# Patient Record
Sex: Female | Born: 1995 | Race: Black or African American | Hispanic: No | Marital: Single | Smoking: Never smoker
Health system: Southern US, Community
[De-identification: ages and names within clinical notes are randomized; demographics above are authoritative.]

## PROBLEM LIST (undated history)

## (undated) HISTORY — PX: DENTAL SURGERY: SHX609

---
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
## 2017-07-18 ENCOUNTER — Emergency Department: Payer: No Typology Code available for payment source

## 2017-07-18 ENCOUNTER — Emergency Department
Admission: EM | Admit: 2017-07-18 | Discharge: 2017-07-18 | Disposition: A | Discharge status: Home / Self Care | Payer: No Typology Code available for payment source | Attending: Emergency Medicine | Admitting: Emergency Medicine

## 2017-07-18 ENCOUNTER — Encounter: Payer: Self-pay | Admitting: Emergency Medicine

## 2017-07-18 DIAGNOSIS — Y999 Unspecified external cause status: Secondary | ICD-10-CM | POA: Insufficient documentation

## 2017-07-18 DIAGNOSIS — Y9389 Activity, other specified: Secondary | ICD-10-CM | POA: Diagnosis not present

## 2017-07-18 DIAGNOSIS — Z79899 Other long term (current) drug therapy: Secondary | ICD-10-CM | POA: Insufficient documentation

## 2017-07-18 DIAGNOSIS — M542 Cervicalgia: Secondary | ICD-10-CM | POA: Insufficient documentation

## 2017-07-18 DIAGNOSIS — Y929 Unspecified place or not applicable: Secondary | ICD-10-CM | POA: Diagnosis not present

## 2017-07-18 DIAGNOSIS — M545 Low back pain: Secondary | ICD-10-CM | POA: Insufficient documentation

## 2017-07-18 MED ORDER — IBUPROFEN 800 MG PO TABS: 800.0000 mg | ORAL_TABLET | Freq: Three times a day (TID) | ORAL | 0 refills | Status: DC | PRN

## 2017-07-18 MED ORDER — CYCLOBENZAPRINE HCL 5 MG PO TABS: ORAL_TABLET | ORAL | 0 refills | Status: DC
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
## 2017-08-26 ENCOUNTER — Ambulatory Visit
Admission: EM | Admit: 2017-08-26 | Discharge: 2017-08-26 | Disposition: A | Discharge status: Home / Self Care | Payer: Medicaid Other | Attending: Family Medicine | Admitting: Family Medicine

## 2017-08-26 ENCOUNTER — Encounter: Payer: Self-pay | Admitting: Emergency Medicine

## 2017-08-26 DIAGNOSIS — J40 Bronchitis, not specified as acute or chronic: Secondary | ICD-10-CM | POA: Diagnosis not present

## 2017-08-26 DIAGNOSIS — R05 Cough: Secondary | ICD-10-CM

## 2017-08-26 MED ORDER — BENZONATATE 100 MG PO CAPS: 100.0000 mg | ORAL_CAPSULE | Freq: Three times a day (TID) | ORAL | 0 refills | Status: DC | PRN

## 2017-08-26 NOTE — ED Triage Notes (Signed)
Patient states she has a non-productive cough which is making her chest and stomach hurt

## 2017-08-26 NOTE — ED Provider Notes (Signed)
[facility]    CSN: [id number] Arrival date & time: 08/26/17  1443  History   Chief Complaint Chief Complaint  Patient presents with  . Cough    online appt.   HPI  21 year old female presents with cough.  Patient reports that she has had a one-week history of cough.  Nonproductive.  Moderate in severity.  No associated fevers or chills.  No shortness of breath.  She has had a sick contact at work.  She has taken TheraFlu without improvement.  No known exacerbating factors.  No other associated symptoms.  No other complaints at this time.  PMH - None  Past Surgical History:  Procedure Laterality Date  . DENTAL SURGERY     OB History    No data available      Home Medications    Prior to Admission medications   Medication Sig Start Date End Date Taking? Authorizing Provider  benzonatate (TESSALON) 100 MG capsule Take 1 capsule (100 mg total) by mouth 3 (three) times daily as needed. 08/26/17   Tommie Samsook, Nyasia Baxley G, DO   Family History Family History  Problem Relation Age of Onset  . Diabetes Father   . Hypertension Other    Social History Social History   Tobacco Use  . Smoking status: Never Smoker  . Smokeless tobacco: Never Used  Substance Use Topics  . Alcohol use: No    Frequency: Never  . Drug use: No   Allergies   Amoxicillin   Review of Systems Review of Systems  Respiratory: Positive for cough.   All other systems reviewed and are negative.  Physical Exam Triage Vital Signs ED Triage Vitals  Enc Vitals Group     BP 08/26/17 1519 130/69     Pulse Rate 08/26/17 1519 88     Resp 08/26/17 1519 18     Temp 08/26/17 1519 99.1 F (37.3 C)     Temp src --      SpO2 08/26/17 1519 100 %     Weight 08/26/17 1515 176 lb 2.4 oz (79.9 kg)     Height 08/26/17 1515 5\' 6"  (1.676 m)     Head Circumference --      Peak Flow --      Pain Score 08/26/17 1516 7     Pain Loc --      Pain Edu? --      Excl. in GC? --    No data found.  Updated  Vital Signs BP 130/69 (BP Location: Left Arm)   Pulse 88   Temp 99.1 F (37.3 C)   Resp 18   Ht 5\' 6"  (1.676 m)   Wt 176 lb 2.4 oz (79.9 kg)   LMP 08/06/2017 (Exact Date)   SpO2 100%   BMI 28.43 kg/m   Visual Acuity Right Eye Distance:   Left Eye Distance:   Bilateral Distance:    Right Eye Near:   Left Eye Near:    Bilateral Near:     Physical Exam  Constitutional: She is oriented to person, place, and time. She appears well-developed. No distress.  HENT:  Head: Normocephalic and atraumatic.  Mouth/Throat: Oropharynx is clear and moist.  Normal TMs bilaterally.  Eyes: Conjunctivae are normal. Right eye exhibits no discharge. Left eye exhibits no discharge.  Cardiovascular: Normal rate and regular rhythm.  No murmur heard. Pulmonary/Chest: Effort normal and breath sounds normal. She has no wheezes. She exhibits no tenderness.  Abdominal: Soft. She exhibits no distension. There is  no tenderness.  Neurological: She is alert and oriented to person, place, and time.  Normal speech. No apparent deficits.  Skin: Skin is warm. No rash noted.  Psychiatric: She has a normal mood and affect. Her behavior is normal.  Vitals reviewed.  UC Treatments / Results  Labs (all labs ordered are listed, but only abnormal results are displayed) Labs Reviewed - No data to display  EKG  EKG Interpretation None       Radiology No results found.  Procedures Procedures (including critical care time)  Medications Ordered in UC Medications - No data to display   Initial Impression / Assessment and Plan / UC Course  I have reviewed the triage vital signs and the nursing notes.  Pertinent labs & imaging results that were available during my care of the patient were reviewed by me and considered in my medical decision making (see chart for details).     21 year old female presents with acute bronchitis.  Treating with Tessalon Perles.  Final Clinical Impressions(s) / UC  Diagnoses   Final diagnoses:  Bronchitis    ED Discharge Orders        Ordered    benzonatate (TESSALON) 100 MG capsule  3 times daily PRN     08/26/17 1554     Controlled Substance Prescriptions [place] Controlled Substance Registry consulted? Not Applicable   Tommie SamsCook, Ames Hoban G, [place]DO 08/26/17 [phone]

## 2017-08-26 NOTE — Discharge Instructions (Signed)
Use the cough medication as needed.  Take care  Dr. Adriana Simasook

## 2018-03-12 ENCOUNTER — Emergency Department
Admission: EM | Admit: 2018-03-12 | Discharge: 2018-03-12 | Disposition: A | Discharge status: Home / Self Care | Payer: BLUE CROSS/BLUE SHIELD | Attending: Emergency Medicine | Admitting: Emergency Medicine

## 2018-03-12 ENCOUNTER — Other Ambulatory Visit: Payer: Self-pay

## 2018-03-12 DIAGNOSIS — R55 Syncope and collapse: Secondary | ICD-10-CM

## 2018-03-12 DIAGNOSIS — E860 Dehydration: Secondary | ICD-10-CM | POA: Insufficient documentation

## 2018-03-12 DIAGNOSIS — R42 Dizziness and giddiness: Secondary | ICD-10-CM | POA: Diagnosis present

## 2018-03-12 LAB — CBC
HCT: 39.9 % (ref 35.0–47.0)
Hemoglobin: 13.7 g/dL (ref 12.0–16.0)
MCH: 32.9 pg (ref 26.0–34.0)
MCHC: 34.3 g/dL (ref 32.0–36.0)
MCV: 95.6 fL (ref 80.0–100.0)
PLATELETS: 218 10*3/uL (ref 150–440)
RBC: 4.17 MIL/uL (ref 3.80–5.20)
RDW: 12.8 % (ref 11.5–14.5)
WBC: 7.4 10*3/uL (ref 3.6–11.0)

## 2018-03-12 LAB — URINALYSIS, COMPLETE (UACMP) WITH MICROSCOPIC
BACTERIA UA: NONE SEEN
Bilirubin Urine: NEGATIVE
Glucose, UA: NEGATIVE mg/dL
Ketones, ur: 5 mg/dL — AB
LEUKOCYTES UA: NEGATIVE
NITRITE: NEGATIVE
PROTEIN: NEGATIVE mg/dL
Specific Gravity, Urine: 1.008 (ref 1.005–1.030)
pH: 6 (ref 5.0–8.0)

## 2018-03-12 LAB — BASIC METABOLIC PANEL
Anion gap: 9 (ref 5–15)
BUN: 12 mg/dL (ref 6–20)
CALCIUM: 8.5 mg/dL — AB (ref 8.9–10.3)
CHLORIDE: 103 mmol/L (ref 101–111)
CO2: 20 mmol/L — ABNORMAL LOW (ref 22–32)
CREATININE: 0.82 mg/dL (ref 0.44–1.00)
Glucose, Bld: 97 mg/dL (ref 65–99)
Potassium: 3.8 mmol/L (ref 3.5–5.1)
SODIUM: 132 mmol/L — AB (ref 135–145)

## 2018-03-12 LAB — POCT PREGNANCY, URINE: PREG TEST UR: NEGATIVE

## 2018-03-12 MED ORDER — SODIUM CHLORIDE 0.9 % IV BOLUS
1000.0000 mL | Freq: Once | INTRAVENOUS | Status: AC
  Administered 2018-03-12: 1000 mL via INTRAVENOUS

## 2018-03-12 NOTE — Discharge Instructions (Addendum)

## 2018-03-12 NOTE — ED Notes (Signed)
Warm blankets provided to patient/ skin warm and dry. resp even and nonlabored/mom with pt at bedside

## 2018-03-12 NOTE — ED Triage Notes (Signed)
Pt here with mom who states that pt called her today saying she felt numb and dizzy. States legs are numb and she feels weak. Alert, oriented, in wheelchair. No distress noted. Speaking in clear, complete sentences.

## 2018-03-12 NOTE — ED Triage Notes (Signed)
Was at work.  Started with numbness and rapid resp.  Is on menses.  132/70--hr was 120 on scene and came downt o 90

## 2018-03-12 NOTE — ED Provider Notes (Signed)
[facility] Emergency Department Provider Note   ____________________________________________   First MD Initiated Contact with Patient 03/12/18 1227     (approximate)  I have reviewed the triage vital signs and the nursing notes.   HISTORY  Chief Complaint Dizziness    HPI Connie Morris is a 22 y.o. 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  She then felt as though she was going to start to black out, but was able to sit down, but felt so very weak that she could not get herself back up without feeling like she was going to pass out.  She had to lay down.  She did not pass out fall or become injured.  She reports that she had not anything to eat or drink today, she has been on her menstrual cycle now for a couple of days and took ibuprofen this morning for her normal menstrual cramps.  Her.  Is not been heavier than normal.  Denies abdominal pain currently not in any pain.  No fevers or chills.  No recent illness.  She has never passed out before.  She did not pass out completely.  She reports after still feeling very lightheaded she began to feel very short of panicky because she thought she cannot pass out, then she felt like her whole body went numb for a few minutes time and felt like her hands were crampy.  The symptoms have since all gone away.  She feels much better now   History reviewed. No pertinent past medical history.  There are no active problems to display for this patient.   Past Surgical History:  Procedure Laterality Date  . DENTAL SURGERY      Medications PRN ibuprofen for menstrual cycle cramping, denies taking any other medicines.  No birth controls.  Allergies Amoxicillin  Family History  Problem Relation Age of Onset  . Diabetes Father   . Hypertension Other     Social History Social History   Tobacco Use  . Smoking status:  Never Smoker  . Smokeless tobacco: Never Used  Substance Use Topics  . Alcohol use: No    Frequency: Never  . Drug use: No    Review of Systems Constitutional: No fever/chills Eyes: No visual changes. ENT: No sore throat.  Cardiovascular: Denies chest pain. Respiratory: Denies shortness of breath. Gastrointestinal: No abdominal pain.  No nausea, no vomiting.  No diarrhea.  No constipation. Genitourinary: Negative for dysuria.  On her menses, normal cycle.  Not heavy. Musculoskeletal: Negative for back pain. Skin: Negative for rash. Neurological: Negative for headaches, focal weakness or numbness.    ____________________________________________   PHYSICAL EXAM:  VITAL SIGNS: ED Triage Vitals  Enc Vitals Group     BP 03/12/18 1032 129/80     Pulse Rate 03/12/18 1032 85     Resp 03/12/18 1032 18     Temp 03/12/18 1032 98.4 F (36.9 C)     Temp Source 03/12/18 1032 Oral     SpO2 03/12/18 1032 100 %     Weight 03/12/18 1037 175 lb (79.4 kg)     Height 03/12/18 1037 5\' 6"  (1.676 m)     Head Circumference --      Peak Flow --      Pain Score 03/12/18 1037 0     Pain Loc --      Pain Edu? --  Excl. in GC? --     Constitutional: Alert and oriented. Well appearing and in no acute distress. Eyes: Conjunctivae are normal. Head: Atraumatic. Nose: No congestion/rhinnorhea. Mouth/Throat: Mucous membranes are moist. Neck: No stridor.   Cardiovascular: Normal rate, regular rhythm. Grossly normal heart sounds.  Good peripheral circulation. Respiratory: Normal respiratory effort.  No retractions. Lungs CTAB. Gastrointestinal: Soft and nontender. No distention. Musculoskeletal: No lower extremity tenderness nor edema. Neurologic:  Normal speech and language. No gross focal neurologic deficits are appreciated.  Skin:  Skin is warm, dry and intact. No rash noted. Psychiatric: Mood and affect are normal. Speech and behavior are  normal.  ____________________________________________   LABS (all labs ordered are listed, but only abnormal results are displayed)  Labs Reviewed  BASIC METABOLIC PANEL - Abnormal; Notable for the following components:      Result Value   Sodium 132 (*)    CO2 20 (*)    Calcium 8.5 (*)    All other components within normal limits  URINALYSIS, COMPLETE (UACMP) WITH MICROSCOPIC - Abnormal; Notable for the following components:   Color, Urine STRAW (*)    APPearance CLEAR (*)    Hgb urine dipstick MODERATE (*)    Ketones, ur 5 (*)    All other components within normal limits  CBC  CBG MONITORING, ED  POC URINE PREG, ED  POCT PREGNANCY, URINE   ____________________________________________  EKG  Reviewed enterotomy at 10:45 AM Heart rate 85 QRS 70 QTc 430 Normal sinus rhythm, no evidence of ischemia or ectopy. Normal QT. No WPW or Brugada. ____________________________________________  RADIOLOGY   ____________________________________________   PROCEDURES  Procedure(s) performed: None  Procedures  Critical Care performed: No  ____________________________________________   INITIAL IMPRESSION / ASSESSMENT AND PLAN / ED COURSE  Pertinent labs & imaging results that were available during my care of the patient were reviewed by me and considered in my medical decision making (see chart for details).  Patient presents for evaluation of near syncope.  Her symptoms in conjunction with reassuring lab work seems to indicate likely vasovagal syncope possibly some orthostatic hypotension brought about by standing at work for about an hour and not having hydrated prior to going in.  She is currently asymptomatic and appears much improved.  She is slightly low sodium and slightly low bicarbonate on her labs suggestive of possible dehydration, or she may have potentially been hyperventilating.  She has no evidence of an acute neurologic etiology.  No cardiac or pulmonary symptoms  or palpitations.  Very reassuring clinical examination including no associated abdominal pain.  Lab work reassuring.  After receiving IV fluids, patient reports he feels much improved.  Resting comfortably no distress.  Discussed with patient and mother plan of care, follow-up and careful return precautions.  Return precautions and treatment recommendations and follow-up discussed with the patient who is agreeable with the plan.       ____________________________________________   FINAL CLINICAL IMPRESSION(S) / ED DIAGNOSES  Final diagnoses:  Vasovagal near syncope  Dehydration, mild      NEW MEDICATIONS STARTED DURING THIS VISIT:  Discharge Medication List as of 03/12/2018  2:59 PM       Note:  This document was prepared using Dragon voice recognition software and may include unintentional dictation errors.     Sharyn Creamer, MD 03/12/18 [phone]

## 2018-03-17 ENCOUNTER — Telehealth: Payer: Self-pay | Admitting: Emergency Medicine

## 2018-03-17 NOTE — Telephone Encounter (Signed)
Pt calling to get a return to work note.  Spoke to pt she recalls her abd Dr.Quales conversation that she could return to work the following day.  Work note printed
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
## 2018-09-29 ENCOUNTER — Other Ambulatory Visit: Payer: Self-pay

## 2018-09-29 ENCOUNTER — Emergency Department
Admission: EM | Admit: 2018-09-29 | Discharge: 2018-09-29 | Disposition: A | Discharge status: Home / Self Care | Payer: Medicaid Other | Attending: Emergency Medicine | Admitting: Emergency Medicine

## 2018-09-29 ENCOUNTER — Encounter: Payer: Self-pay | Admitting: Emergency Medicine

## 2018-09-29 DIAGNOSIS — R1030 Lower abdominal pain, unspecified: Secondary | ICD-10-CM | POA: Diagnosis present

## 2018-09-29 DIAGNOSIS — N390 Urinary tract infection, site not specified: Secondary | ICD-10-CM

## 2018-09-29 LAB — URINALYSIS, COMPLETE (UACMP) WITH MICROSCOPIC
BACTERIA UA: NONE SEEN
Bilirubin Urine: NEGATIVE
Glucose, UA: NEGATIVE mg/dL
KETONES UR: NEGATIVE mg/dL
Nitrite: NEGATIVE
Protein, ur: 100 mg/dL — AB
RBC / HPF: >50 RBC/hpf — ABNORMAL HIGH (ref 0–5)
Specific Gravity, Urine: 1.029 (ref 1.005–1.030)
pH: 6 (ref 5.0–8.0)

## 2018-09-29 LAB — CBC WITH DIFFERENTIAL/PLATELET
Abs Immature Granulocytes: 0.02 10*3/uL (ref 0.00–0.07)
BASOS ABS: 0 10*3/uL (ref 0.0–0.1)
BASOS PCT: 0 %
EOS ABS: 0.1 10*3/uL (ref 0.0–0.5)
Eosinophils Relative: 1 %
HCT: 38.9 % (ref 36.0–46.0)
Hemoglobin: 12.9 g/dL (ref 12.0–15.0)
IMMATURE GRANULOCYTES: 0 %
Lymphocytes Relative: 24 %
Lymphs Abs: 1.9 10*3/uL (ref 0.7–4.0)
MCH: 31.3 pg (ref 26.0–34.0)
MCHC: 33.2 g/dL (ref 30.0–36.0)
MCV: 94.4 fL (ref 80.0–100.0)
MONOS PCT: 9 %
Monocytes Absolute: 0.8 10*3/uL (ref 0.1–1.0)
NEUTROS PCT: 66 %
NRBC: 0 % (ref 0.0–0.2)
Neutro Abs: 5.4 10*3/uL (ref 1.7–7.7)
Platelets: 245 10*3/uL (ref 150–400)
RBC: 4.12 MIL/uL (ref 3.87–5.11)
RDW: 12.4 % (ref 11.5–15.5)
WBC: 8.2 10*3/uL (ref 4.0–10.5)

## 2018-09-29 LAB — COMPREHENSIVE METABOLIC PANEL
ALT: 15 U/L (ref 0–44)
AST: 21 U/L (ref 15–41)
Albumin: 4 g/dL (ref 3.5–5.0)
Alkaline Phosphatase: 69 U/L (ref 38–126)
Anion gap: 6 (ref 5–15)
BUN: 11 mg/dL (ref 6–20)
CO2: 24 mmol/L (ref 22–32)
Calcium: 8.4 mg/dL — ABNORMAL LOW (ref 8.9–10.3)
Chloride: 108 mmol/L (ref 98–111)
Creatinine, Ser: 0.99 mg/dL (ref 0.44–1.00)
GFR calc Af Amer: >60 mL/min (ref >60)
Glucose, Bld: 108 mg/dL — ABNORMAL HIGH (ref 70–99)
POTASSIUM: 3.9 mmol/L (ref 3.5–5.1)
Sodium: 138 mmol/L (ref 135–145)
Total Bilirubin: 0.6 mg/dL (ref 0.3–1.2)
Total Protein: 7.3 g/dL (ref 6.5–8.1)

## 2018-09-29 LAB — POCT PREGNANCY, URINE: PREG TEST UR: NEGATIVE

## 2018-09-29 LAB — LIPASE, BLOOD: LIPASE: 35 U/L (ref 11–51)

## 2018-09-29 MED ORDER — IBUPROFEN 600 MG PO TABS
600.0000 mg | ORAL_TABLET | Freq: Once | ORAL | Status: AC
  Administered 2018-09-29: 600 mg via ORAL
  Filled 2018-09-29: qty 1

## 2018-09-29 MED ORDER — PHENAZOPYRIDINE HCL 200 MG PO TABS: 200.0000 mg | ORAL_TABLET | Freq: Three times a day (TID) | ORAL | 0 refills | Status: DC | PRN

## 2018-09-29 MED ORDER — CIPROFLOXACIN HCL 500 MG PO TABS: 500.0000 mg | ORAL_TABLET | Freq: Two times a day (BID) | ORAL | 0 refills | Status: DC

## 2018-09-29 MED ORDER — CIPROFLOXACIN HCL 500 MG PO TABS
500.0000 mg | ORAL_TABLET | Freq: Once | ORAL | Status: AC
  Administered 2018-09-29: 500 mg via ORAL
  Filled 2018-09-29: qty 1

## 2018-09-29 MED ORDER — PHENAZOPYRIDINE HCL 200 MG PO TABS
200.0000 mg | ORAL_TABLET | Freq: Once | ORAL | Status: AC
  Administered 2018-09-29: 200 mg via ORAL
  Filled 2018-09-29: qty 1

## 2018-09-29 NOTE — Discharge Instructions (Signed)
1.  Start antibiotic as prescribed (Cipro 500 mg twice daily x7 days). 2.  Take urinary pain medicine as prescribed (Pyridium #6). 3.  Drink plenty of fluids daily. 4.  You will be notified of any positive urine culture results requiring a change in your antibiotic. 5.  Return to the ER for worsening symptoms, persistent vomiting, difficulty breathing or other concerns.

## 2018-09-29 NOTE — ED Provider Notes (Signed)
[facility] Emergency Department Provider Note   ____________________________________________   First MD Initiated Contact with Patient 09/29/18 0532     (approximate)  I have reviewed the triage vital signs and the nursing notes.   HISTORY  Chief Complaint Abdominal Pain    HPI Connie Morris is a 23 y.o. female who presents to the ED from home with a chief complaint of suprapubic abdominal pain, dysuria and hematuria.  Symptoms x1 day.  Denies associated fever, chills, chest pain, shortness of breath, nausea, vomiting, flank pain.  Denies recent travel or trauma.  Denies personal history of kidney stones.   Past medical history None  There are no active problems to display for this patient.   Past Surgical History:  Procedure Laterality Date  . DENTAL SURGERY      Prior to Admission medications   Medication Sig Start Date End Date Taking? Authorizing Provider  benzonatate (TESSALON) 100 MG capsule Take 1 capsule (100 mg total) by mouth 3 (three) times daily as needed. 08/26/17   Tommie Samsook, Jayce G, DO  ciprofloxacin (CIPRO) 500 MG tablet Take 1 tablet (500 mg total) by mouth 2 (two) times daily. 09/29/18   Irean HongSung, Jade J, MD  phenazopyridine (PYRIDIUM) 200 MG tablet Take 1 tablet (200 mg total) by mouth 3 (three) times daily as needed for pain. 09/29/18   Irean HongSung, Jade J, MD    Allergies Amoxicillin  Family History  Problem Relation Age of Onset  . Diabetes Father   . Hypertension Other     Social History Social History   Tobacco Use  . Smoking status: Never Smoker  . Smokeless tobacco: Never Used  Substance Use Topics  . Alcohol use: No    Frequency: Never  . Drug use: No    Review of Systems  Constitutional: No fever/chills Eyes: No visual changes. ENT: No sore throat. Cardiovascular: Denies chest pain. Respiratory: Denies shortness of breath. Gastrointestinal: Positive for suprapubic abdominal pain.  No nausea, no vomiting.  No  diarrhea.  No constipation. Genitourinary: Positive for dysuria. Musculoskeletal: Negative for back pain. Skin: Negative for rash. Neurological: Negative for headaches, focal weakness or numbness.   ____________________________________________   PHYSICAL EXAM:  VITAL SIGNS: ED Triage Vitals [09/29/18 0403]  Enc Vitals Group     BP 130/85     Pulse Rate 84     Resp 18     Temp 98.2 F (36.8 C)     Temp Source Oral     SpO2 100 %     Weight 170 lb (77.1 kg)     Height 5\' 6"  (1.676 m)     Head Circumference      Peak Flow      Pain Score 8     Pain Loc      Pain Edu?      Excl. in GC?     Constitutional: Alert and oriented. Well appearing and in no acute distress. Eyes: Conjunctivae are normal. PERRL. EOMI. Head: Atraumatic. Nose: No congestion/rhinnorhea. Mouth/Throat: Mucous membranes are moist.  Oropharynx non-erythematous. Neck: No stridor.   Cardiovascular: Normal rate, regular rhythm. Grossly normal heart sounds.  Good peripheral circulation. Respiratory: Normal respiratory effort.  No retractions. Lungs CTAB. Gastrointestinal: Soft and nontender to light or deep palpation. No distention. No abdominal bruits. No CVA tenderness. Musculoskeletal: No lower extremity tenderness nor edema.  No joint effusions. Neurologic:  Normal speech and language. No gross focal neurologic deficits are appreciated. No gait instability. Skin:  Skin  is warm, dry and intact. No rash noted. Psychiatric: Mood and affect are normal. Speech and behavior are normal.  ____________________________________________   LABS (all labs ordered are listed, but only abnormal results are displayed)  Labs Reviewed  URINALYSIS, COMPLETE (UACMP) WITH MICROSCOPIC - Abnormal; Notable for the following components:      Result Value   Color, Urine YELLOW (*)    APPearance CLOUDY (*)    Hgb urine dipstick LARGE (*)    Protein, ur 100 (*)    Leukocytes, UA LARGE (*)    RBC / HPF >50 (*)    WBC, UA  >50 (*)    All other components within normal limits  COMPREHENSIVE METABOLIC PANEL - Abnormal; Notable for the following components:   Glucose, Bld 108 (*)    Calcium 8.4 (*)    All other components within normal limits  URINE CULTURE  CBC WITH DIFFERENTIAL/PLATELET  LIPASE, BLOOD  POCT PREGNANCY, URINE   ____________________________________________  EKG  None ____________________________________________  RADIOLOGY  ED MD interpretation: None  Official radiology report(s): No results found.  ____________________________________________   PROCEDURES  Procedure(s) performed: None  Procedures  Critical Care performed: No  ____________________________________________   INITIAL IMPRESSION / ASSESSMENT AND PLAN / ED COURSE  As part of my medical decision making, I reviewed the following data within the electronic MEDICAL RECORD NUMBER Nursing notes reviewed and incorporated, Labs reviewed and Notes from prior ED visits   23 year old female who presents with dysuria, hematuria and suprapubic abdominal pain.  Return urinalysis results remarkable for UTI.  Patient is penicillin allergic and has never taken Keflex.  Will place on Cipro and Pyridium.  Urine culture is pending.  Strict return precautions given.  Patient and family member verbalize understanding and agree with plan of care.      ____________________________________________   FINAL CLINICAL IMPRESSION(S) / ED DIAGNOSES  Final diagnoses:  Lower urinary tract infectious disease     ED Discharge Orders         Ordered    ciprofloxacin (CIPRO) 500 MG tablet  2 times daily     09/29/18 0542    phenazopyridine (PYRIDIUM) 200 MG tablet  3 times daily PRN     09/29/18 0542           Note:  This document was prepared using Dragon voice recognition software and may include unintentional dictation errors.    Irean Hong, MD 09/29/18 [phone]

## 2018-09-29 NOTE — ED Triage Notes (Signed)
Patient ambulatory to triage with steady gait, without difficulty or distress noted; pt reports since yesterday having lower abd pain with hematuria

## 2018-10-01 LAB — URINE CULTURE: Culture: >=100000 — AB

## 2019-01-10 ENCOUNTER — Telehealth: Payer: Managed Care, Other (non HMO) | Admitting: Nurse Practitioner

## 2019-01-10 DIAGNOSIS — J329 Chronic sinusitis, unspecified: Secondary | ICD-10-CM

## 2019-01-10 DIAGNOSIS — B9789 Other viral agents as the cause of diseases classified elsewhere: Secondary | ICD-10-CM | POA: Diagnosis not present

## 2019-01-10 MED ORDER — FLUTICASONE PROPIONATE 50 MCG/ACT NA SUSP: 2.0000 | Freq: Every day | NASAL | 6 refills | Status: DC

## 2019-01-10 NOTE — Progress Notes (Signed)
We are sorry that you are not feeling well.  Here is how we plan to help!  Based on what you have shared with me it looks like you have sinusitis.  Sinusitis is inflammation and infection in the sinus cavities of the head.  Based on your presentation I believe you most likely have Acute Viral Sinusitis.This is an infection most likely caused by a virus. There is not specific treatment for viral sinusitis other than to help you with the symptoms until the infection runs its course.  You may use an oral decongestant such as Mucinex D or if you have glaucoma or high blood pressure use plain Mucinex. Saline nasal spray help and can safely be used as often as needed for congestion, I have prescribed: Fluticasone nasal spray two sprays in each nostril once a day   Providers prescribe antibiotics to treat infections caused by bacteria. Antibiotics are very powerful in treating bacterial infections when they are used properly. To maintain their effectiveness, they should be used only when necessary. Overuse of antibiotics has resulted in the development of superbugs that are resistant to treatment!    After careful review of your answers, I would not recommend an antibiotic for your condition.  Antibiotics are not effective against viruses and therefore should not be used to treat them. Common examples of infections caused by viruses include colds and flu    Some authorities believe that zinc sprays or the use of Echinacea may shorten the course of your symptoms.  Sinus infections are not as easily transmitted as other respiratory infection, however we still recommend that you avoid close contact with loved ones, especially the very young and elderly.  Remember to wash your hands thoroughly throughout the day as this is the number one way to prevent the spread of infection!  Home Care:  Only take medications as instructed by your medical team.  Do not take these medications with alcohol.  A steam or  ultrasonic humidifier can help congestion.  You can place a towel over your head and breathe in the steam from hot water coming from a faucet.  Avoid close contacts especially the very young and the elderly.  Cover your mouth when you cough or sneeze.  Always remember to wash your hands.  Get Help Right Away If:  You develop worsening fever or sinus pain.  You develop a severe head ache or visual changes.  Your symptoms persist after you have completed your treatment plan.  Make sure you  Understand these instructions.  Will watch your condition.  Will get help right away if you are not doing well or get worse.  Your e-visit answers were reviewed by a board certified advanced clinical practitioner to complete your personal care plan.  Depending on the condition, your plan could have included both over the counter or prescription medications.  If there is a problem please reply  once you have received a response from your provider.  Your safety is important to us.  If you have drug allergies check your prescription carefully.    You can use MyChart to ask questions about today's visit, request a non-urgent call back, or ask for a work or school excuse for 24 hours related to this e-Visit. If it has been greater than 24 hours you will need to follow up with your provider, or enter a new e-Visit to address those concerns.  You will get an e-mail in the next two days asking about your experience.  I   hope that your e-visit has been valuable and will speed your recovery. Thank you for using e-visits.   5 minutes spent reviewing and documenting in chart.

## 2019-01-11 MED ORDER — DOXYCYCLINE HYCLATE 100 MG PO TABS: 100.0000 mg | ORAL_TABLET | Freq: Two times a day (BID) | ORAL | 0 refills | Status: DC

## 2019-01-11 NOTE — Addendum Note (Signed)
Addended by: Bennie Pierini on: 01/11/2019 04:25 PM   Modules accepted: Orders

## 2019-09-27 ENCOUNTER — Ambulatory Visit
Admission: EM | Admit: 2019-09-27 | Discharge: 2019-09-27 | Disposition: A | Discharge status: Home / Self Care | Payer: Self-pay | Attending: Emergency Medicine | Admitting: Emergency Medicine

## 2019-09-27 ENCOUNTER — Other Ambulatory Visit: Payer: Self-pay

## 2019-09-27 ENCOUNTER — Encounter: Payer: Self-pay | Admitting: Emergency Medicine

## 2019-09-27 DIAGNOSIS — X500XXA Overexertion from strenuous movement or load, initial encounter: Secondary | ICD-10-CM

## 2019-09-27 DIAGNOSIS — S29011A Strain of muscle and tendon of front wall of thorax, initial encounter: Secondary | ICD-10-CM

## 2019-09-27 DIAGNOSIS — S53402A Unspecified sprain of left elbow, initial encounter: Secondary | ICD-10-CM

## 2019-09-27 MED ORDER — IBUPROFEN 600 MG PO TABS: 600.0000 mg | ORAL_TABLET | Freq: Four times a day (QID) | ORAL | 0 refills | Status: AC | PRN

## 2019-09-27 MED ORDER — TIZANIDINE HCL 4 MG PO TABS: 4.0000 mg | ORAL_TABLET | Freq: Three times a day (TID) | ORAL | 0 refills | Status: AC | PRN

## 2019-09-27 NOTE — ED Triage Notes (Signed)
Patient states she was lifting a heavy box 2 days ago and lifted it over her head and now has left elbow/arm pain and swelling.

## 2019-09-27 NOTE — Discharge Instructions (Addendum)
We are applying an Ace wrap to your left elbow here.  This will help with pain and swelling.  Take 600 mg ibuprofen with 1000 mg of Tylenol 3-4 times a day.  Take it together.  This is an effective combination for pain.  Continue ice on your elbow.   Zanaflex will help with the muscle spasm in your pectoralis muscle.  Follow-up here in 2 to 3 days if you are not getting significantly better we can consider doing x-rays at that time.

## 2019-09-27 NOTE — ED Provider Notes (Signed)
HPI  SUBJECTIVE:  Connie Morris is a right-handed 23 y.o. female who presents with left elbow pain starting 2 days ago.  States that she was lifting a box over her head and she felt something "pull" along the posterior aspect of her elbow.  She states that it now radiates to her shoulder.  She describes the pain as stabbing, sharp, constant.  She reports swelling at the elbow and some tingling in her fingers.  She states that her shoulder is now sore as well.  Denies limitation of motion of her shoulder or elbow.  No erythema, grip weakness, numbness in her hand.  No fevers.  She tried ice with improvement in her symptoms and she also tried Tylenol PM.  Symptoms worse with full extension, flexing at 90 degrees for long period of time.  She did this at work, but this will not be a workers comp case.  She has no history of left elbow or shoulder injury, diabetes, hypertension.  LMP: 2 weeks ago.  Denies possibility of being pregnant.  PMD: [facility], [place]   History reviewed. No pertinent past medical history.  Past Surgical History:  Procedure Laterality Date  . DENTAL SURGERY      Family History  Problem Relation Age of Onset  . Diabetes Father   . Hypertension Other     Social History   Tobacco Use  . Smoking status: Never Smoker  . Smokeless tobacco: Never Used  Substance Use Topics  . Alcohol use: No  . Drug use: No    No current facility-administered medications for this encounter.  Current Outpatient Medications:  .  ibuprofen (ADVIL) 600 MG tablet, Take 1 tablet (600 mg total) by mouth every 6 (six) hours as needed., Disp: 30 tablet, Rfl: 0 .  tiZANidine (ZANAFLEX) 4 MG tablet, Take 1 tablet (4 mg total) by mouth every 8 (eight) hours as needed for muscle spasms., Disp: 30 tablet, Rfl: 0  Allergies  Allergen Reactions  . Amoxicillin Hives     ROS  As noted in HPI.   Physical Exam  BP (!) 148/85 (BP Location: Right Arm)   Pulse 80   Temp  98.2 F (36.8 C) (Oral)   Resp 18   Ht 5\' 7"  (1.702 m)   Wt 88.5 kg   LMP 09/13/2019   SpO2 100%   BMI 30.54 kg/m   Constitutional: Well developed, well nourished, no acute distress Eyes:  EOMI, conjunctiva normal bilaterally HENT: Normocephalic, atraumatic,mucus membranes moist Respiratory: Normal inspiratory effort Cardiovascular: Normal rate.  RP 2+. GI: nondistended skin: No rash, skin intact Musculoskeletal: no deformities, elbows, arms symmetric.  Positive tenderness, muscle spasm left pectoral muscle.  Negative Popeye sign.  Patient has full range of motion of her left shoulder.  No bony or joint tenderness over shoulder. L Elbow ROM Normal for Pt, Supracondylar region NT, Radial head NT , Olecrenon process NT, Medial epicondyle  tender, Lateral epicondyle NT, distal arm, wrist NT, Hand NT with distal NVI CR<2secs, radial pulse intact, Sensation LT and Motor grossly intact distally in distribution of radial, median, and ulnar nerve function. Neurologic: Alert & oriented x 3, no focal neuro deficits Psychiatric: Speech and behavior appropriate   ED Course   Medications - No data to display  Orders Placed This Encounter  Procedures  . Apply ace wrap    Left elbow    Standing Status:   Standing    Number of Occurrences:   1  No results found for this or any previous visit (from the past 24 hour(s)). No results found.  ED Impression  1. Sprain of left elbow, initial encounter   2. Pectoralis muscle strain, initial encounter      ED Assessment/Plan  We talked about doing an x-ray today, but in the absence of direct trauma, limitation of motion, feel that the likelihood of a clinically significant fracture is low. she is willing to try treating this supportively with Tylenol/ibuprofen, and Zanaflex and if she is not better in several days, she will return here we can get x-rays at that time.  Follow-up with [facility] if it continues to give her problems.  Applying  Ace wrap here.  Discussed  MDM, treatment plan, and plan for follow-up with patient. . patient agrees with plan.   Meds ordered this encounter  Medications  . tiZANidine (ZANAFLEX) 4 MG tablet    Sig: Take 1 tablet (4 mg total) by mouth every 8 (eight) hours as needed for muscle spasms.    Dispense:  30 tablet    Refill:  0  . ibuprofen (ADVIL) 600 MG tablet    Sig: Take 1 tablet (600 mg total) by mouth every 6 (six) hours as needed.    Dispense:  30 tablet    Refill:  0    *This clinic note was created using Scientist, clinical (histocompatibility and immunogenetics). Therefore, there may be occasional mistakes despite careful proofreading.   ?    Domenick Gong, MD 09/29/19 [phone]

## 2020-12-04 ENCOUNTER — Other Ambulatory Visit: Payer: Self-pay | Admitting: Internal Medicine

## 2020-12-04 DIAGNOSIS — R1011 Right upper quadrant pain: Secondary | ICD-10-CM

## 2020-12-04 DIAGNOSIS — R1010 Upper abdominal pain, unspecified: Secondary | ICD-10-CM

## 2020-12-04 DIAGNOSIS — R1013 Epigastric pain: Secondary | ICD-10-CM

## 2020-12-10 ENCOUNTER — Emergency Department: Payer: BC Managed Care – PPO

## 2020-12-10 ENCOUNTER — Other Ambulatory Visit: Payer: Self-pay

## 2020-12-10 ENCOUNTER — Emergency Department
Admission: EM | Admit: 2020-12-10 | Discharge: 2020-12-10 | Disposition: A | Discharge status: Home / Self Care | Payer: BC Managed Care – PPO | Attending: Emergency Medicine | Admitting: Emergency Medicine

## 2020-12-10 DIAGNOSIS — R1011 Right upper quadrant pain: Secondary | ICD-10-CM | POA: Insufficient documentation

## 2020-12-10 DIAGNOSIS — R1013 Epigastric pain: Secondary | ICD-10-CM | POA: Insufficient documentation

## 2020-12-10 LAB — URINALYSIS, COMPLETE (UACMP) WITH MICROSCOPIC
Bilirubin Urine: NEGATIVE
Glucose, UA: NEGATIVE mg/dL
Hgb urine dipstick: NEGATIVE
Leukocytes,Ua: NEGATIVE
Nitrite: NEGATIVE
Protein, ur: NEGATIVE mg/dL
Specific Gravity, Urine: 1.02 (ref 1.005–1.030)
pH: 7.5 (ref 5.0–8.0)

## 2020-12-10 LAB — POC URINE PREG, ED: Preg Test, Ur: NEGATIVE

## 2020-12-10 LAB — CBC
HCT: 40.3 % (ref 36.0–46.0)
Hemoglobin: 13.3 g/dL (ref 12.0–15.0)
MCH: 30.7 pg (ref 26.0–34.0)
MCHC: 33 g/dL (ref 30.0–36.0)
MCV: 93.1 fL (ref 80.0–100.0)
Platelets: 175 10*3/uL (ref 150–400)
RBC: 4.33 MIL/uL (ref 3.87–5.11)
RDW: 12.7 % (ref 11.5–15.5)
WBC: 6.6 10*3/uL (ref 4.0–10.5)
nRBC: 0.5 % — ABNORMAL HIGH (ref 0.0–0.2)

## 2020-12-10 LAB — LIPASE, BLOOD: Lipase: 39 U/L (ref 11–51)

## 2020-12-10 LAB — COMPREHENSIVE METABOLIC PANEL
ALT: 17 U/L (ref 0–44)
AST: 21 U/L (ref 15–41)
Albumin: 4.2 g/dL (ref 3.5–5.0)
Alkaline Phosphatase: 71 U/L (ref 38–126)
Anion gap: 7 (ref 5–15)
BUN: 11 mg/dL (ref 6–20)
CO2: 25 mmol/L (ref 22–32)
Calcium: 9 mg/dL (ref 8.9–10.3)
Chloride: 105 mmol/L (ref 98–111)
Creatinine, Ser: 0.86 mg/dL (ref 0.44–1.00)
GFR, Estimated: >60 mL/min (ref >60)
Glucose, Bld: 90 mg/dL (ref 70–99)
Potassium: 4 mmol/L (ref 3.5–5.1)
Sodium: 137 mmol/L (ref 135–145)
Total Bilirubin: 0.5 mg/dL (ref 0.3–1.2)
Total Protein: 7.9 g/dL (ref 6.5–8.1)

## 2020-12-10 NOTE — Discharge Instructions (Signed)
Follow-up with your primary care provider if any continued problems or concerns.  Take medication prescribed by your doctor every day until instructed otherwise.  Avoid fatty foods, greasy foods at this time as it may aggravate your stomach.  Also anything with lots of acid or caffeine may also cause you more discomfort temporarily.

## 2020-12-10 NOTE — ED Provider Notes (Signed)
[facility] Emergency Department Provider Note   ____________________________________________   Event Date/Time   First MD Initiated Contact with Patient 12/10/20 1332     (approximate)  I have reviewed the triage vital signs and the nursing notes.   HISTORY  Chief Complaint Abdominal Pain   HPI Connie Morris is a 25 y.o. female presents to the ED with complaint of epigastric and right upper quadrant pain for approximately 4 weeks.  Patient states that she saw her PCP and placed on Bentyl 10 mg 4 times daily and Protonix 40 mg daily.  Patient states that she woke this morning and felt "funny".  She denies any fever, chills, nausea or vomiting.  There is been no diarrhea.  Patient states that the epigastric tenderness continues.  Reports that she had a fried chicken sandwich last night from [facility] and also ate some chicken wings.  She also reports that she has taken the Bentyl but has not been consistent daily with Protonix.  She rates her pain as 7 out of 10.       No past medical history on file.  There are no problems to display for this patient.   Past Surgical History:  Procedure Laterality Date  . DENTAL SURGERY      Prior to Admission medications   Medication Sig Start Date End Date Taking? Authorizing Provider  ibuprofen (ADVIL) 600 MG tablet Take 1 tablet (600 mg total) by mouth every 6 (six) hours as needed. 09/27/19   Domenick Gong, MD  tiZANidine (ZANAFLEX) 4 MG tablet Take 1 tablet (4 mg total) by mouth every 8 (eight) hours as needed for muscle spasms. 09/27/19   Domenick Gong, MD  fluticasone (FLONASE) 50 MCG/ACT nasal spray Place 2 sprays into both nostrils daily. 01/10/19 09/27/19  Bennie Pierini, FNP    Allergies Amoxicillin  Family History  Problem Relation Age of Onset  . Diabetes Father   . Hypertension Other     Social History Social History   Tobacco Use  . Smoking status: Never Smoker  . Smokeless  tobacco: Never Used  Substance Use Topics  . Alcohol use: No  . Drug use: No    Review of Systems Constitutional: No fever/chills Eyes: No visual changes. ENT: No sore throat. Cardiovascular: Denies chest pain. Respiratory: Denies shortness of breath. Gastrointestinal: Epigastric and right upper quadrant pain.  Negative for nausea and vomiting.  Negative for diarrhea. Genitourinary: Negative for dysuria. Musculoskeletal: Negative for musculoskeletal pain Skin: Negative for rash. Neurological: Negative for headaches, focal weakness or numbness. ____________________________________________   PHYSICAL EXAM:  VITAL SIGNS: ED Triage Vitals  Enc Vitals Group     BP 12/10/20 1256 130/71     Pulse Rate 12/10/20 1256 77     Resp 12/10/20 1256 16     Temp 12/10/20 1256 98.3 F (36.8 C)     Temp Source 12/10/20 1256 Oral     SpO2 12/10/20 1256 100 %     Weight 12/10/20 1257 216 lb (98 kg)     Height 12/10/20 1257 5\' 6"  (1.676 m)     Head Circumference --      Peak Flow --      Pain Score 12/10/20 1257 7     Pain Loc --      Pain Edu? --      Excl. in GC? --     Constitutional: Alert and oriented. Well appearing and in no acute distress. Eyes: Conjunctivae are normal.  Head: Atraumatic.  Nose: No congestion/rhinnorhea. Mouth/Throat: Mucous membranes are moist.  Oropharynx non-erythematous. Neck: No stridor.   Cardiovascular: Normal rate, regular rhythm. Grossly normal heart sounds.  Good peripheral circulation. Respiratory: Normal respiratory effort.  No retractions. Lungs CTAB. Gastrointestinal: Soft.  No distention.  Bowel sounds are normoactive x4 quadrants.  There is epigastric tenderness on palpation which is lessened moving to the right upper quadrant area.  No periumbilical tenderness or McBurney's point.  No rebound tenderness present. Musculoskeletal: Moves upper and lower extremities without any difficulty.  Normal gait was noted. Neurologic:  Normal speech and  language. No gross focal neurologic deficits are appreciated. No gait instability. Skin:  Skin is warm, dry and intact. No rash noted. Psychiatric: Mood and affect are normal. Speech and behavior are normal.  ____________________________________________   LABS (all labs ordered are listed, but only abnormal results are displayed)  Labs Reviewed  CBC - Abnormal; Notable for the following components:      Result Value   nRBC 0.5 (*)    All other components within normal limits  URINALYSIS, COMPLETE (UACMP) WITH MICROSCOPIC - Abnormal; Notable for the following components:   Ketones, ur TRACE (*)    Bacteria, UA RARE (*)    All other components within normal limits  LIPASE, BLOOD  COMPREHENSIVE METABOLIC PANEL  POC URINE PREG, ED   ____________________________________________ ____________________________________________  RADIOLOGY Beaulah Corin, personally viewed and evaluated these images (plain radiographs) as part of my medical decision making, as well as reviewing the written report by the radiologist.   Official radiology report(s): US Abdomen Limited RUQ (LIVER/GB)  Result Date: 12/10/2020 CLINICAL DATA:  Two week history of epigastric pain EXAM: ULTRASOUND ABDOMEN LIMITED RIGHT UPPER QUADRANT COMPARISON:  None. FINDINGS: Gallbladder: No gallstones or wall thickening visualized. There is no pericholecystic fluid. No sonographic Murphy sign noted by sonographer. Common bile duct: Diameter: 2 mm. No intrahepatic or extrahepatic biliary duct dilatation. Liver: No focal lesion identified. Within normal limits in parenchymal echogenicity. Portal vein is patent on color Doppler imaging with normal direction of blood flow towards the liver. Other: None. IMPRESSION: Study within normal limits. Electronically Signed   By: Bretta Bang III M.D.   On: 12/10/2020 15:08    ____________________________________________   PROCEDURES  Procedure(s) performed (including Critical  Care):  Procedures   ____________________________________________   INITIAL IMPRESSION / ASSESSMENT AND PLAN / ED COURSE  As part of my medical decision making, I reviewed the following data within the electronic MEDICAL RECORD NUMBER Notes from prior ED visits and [place] Controlled Substance Database  25 year old female presents to the ED with complaint of intermittent sharp abdominal pain that started 3 to 4 weeks ago.  She was seen by her PCP who placed her on Bentyl and Protonix which she has not completely been compliant.  Patient states pain started this morning.  She admits that she had a fried chicken sandwich last night along with some chicken wings before waking up this morning with pain.  Mother is concerned that this may be her gallbladder.  Patient is tender on exam in the epigastric right upper quadrant area without rebound.  Lab work is reassuring and ultrasound of the gallbladder was negative.  Patient is instructed to continue taking Bentyl and especially the Protonix every day until instructed otherwise by her PCP.  For now she is instructed to avoid greasy, fried foods or foods high in caffeine.  She is to return to the emergency department if any severe worsening of her symptoms.  ____________________________________________   FINAL CLINICAL IMPRESSION(S) / ED DIAGNOSES  Final diagnoses:  Epigastric pain     ED Discharge Orders    None      *Please note:  Connie Morris was evaluated in Emergency Department on 12/10/2020 for the symptoms described in the history of present illness. She was evaluated in the context of the global COVID-19 pandemic, which necessitated consideration that the patient might be at risk for infection with the SARS-CoV-2 virus that causes COVID-19. Institutional protocols and algorithms that pertain to the evaluation of patients at risk for COVID-19 are in a state of rapid change based on information released by regulatory bodies including the CDC and  federal and state organizations. These policies and algorithms were followed during the patient's care in the ED.  Some ED evaluations and interventions may be delayed as a result of limited staffing during and the pandemic.*   Note:  This document was prepared using Dragon voice recognition software and may include unintentional dictation errors.    Tommi Rumps, PA-C 12/10/20 1552    Delton [place], MD 12/10/20 [phone]

## 2020-12-10 NOTE — ED Notes (Signed)
Pt has intermittent sharp abdominal pain since 3-4 weeks ago. Pain is currently in mid epigastric region but pt states pain moves around. Pt feels nauseous but has not had vomiting or diarrhea. Fells nauseous each time she eats. In NAD at this time.

## 2020-12-10 NOTE — ED Triage Notes (Signed)
Pt reports that she has been having epigastric and mid abd pain for the past 3 weeks states that the pain is worse when she eats, states that she was seen by a dr last week who prescribed something that helps a little.

## 2020-12-17 ENCOUNTER — Ambulatory Visit: Payer: BC Managed Care – PPO

## 2022-01-10 IMAGING — US US ABDOMEN LIMITED RUQ/ASCITES
1 series · 14 of 25 positions shown · non-contrast
Comparison: None.

CLINICAL DATA: Two week history of epigastric pain

EXAM:
ULTRASOUND ABDOMEN LIMITED RIGHT UPPER QUADRANT

[Series 1: us abdomen limited ruq (liver/gb) · 14 of 35 slices shown]
[im 1/35]
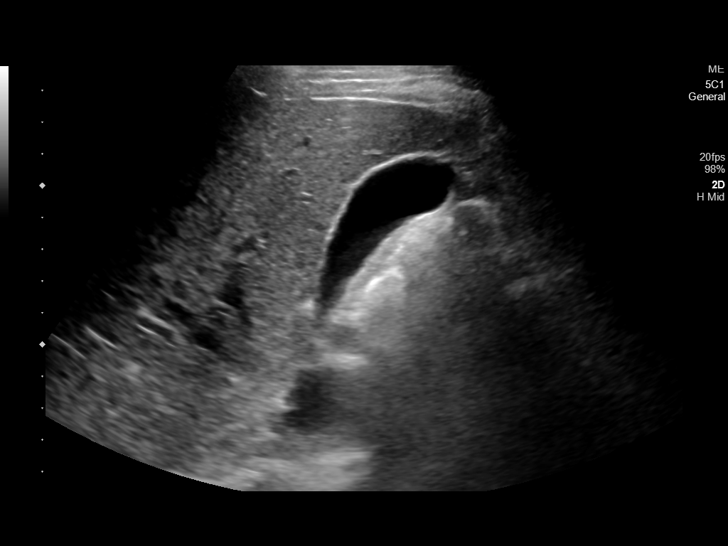
[im 3/35]
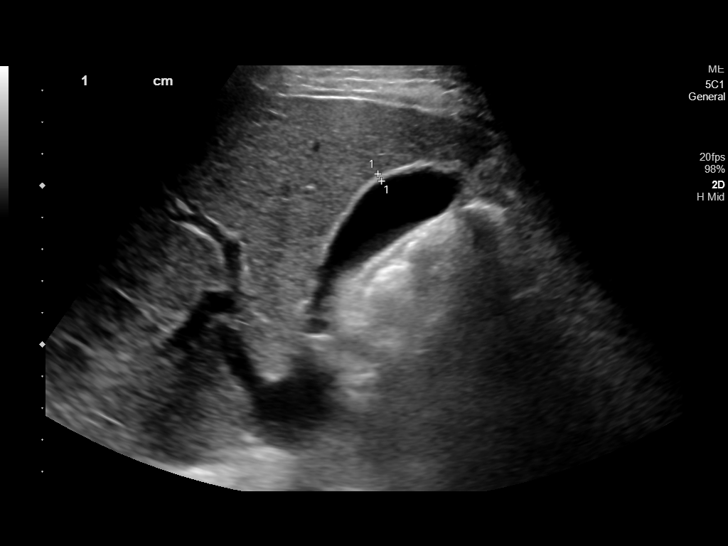
[im 6/35]
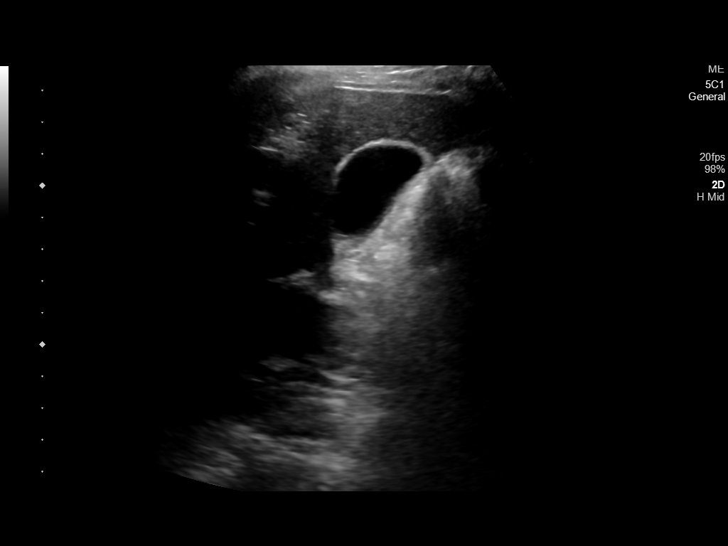
[im 9/35]
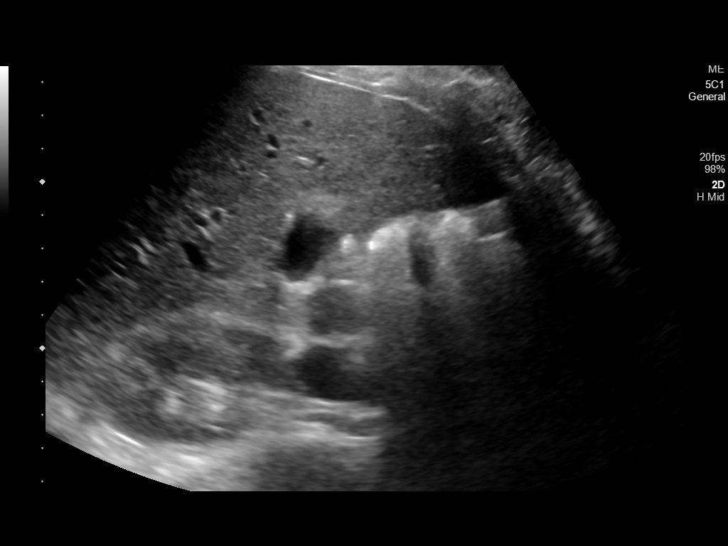
[im 12/35]
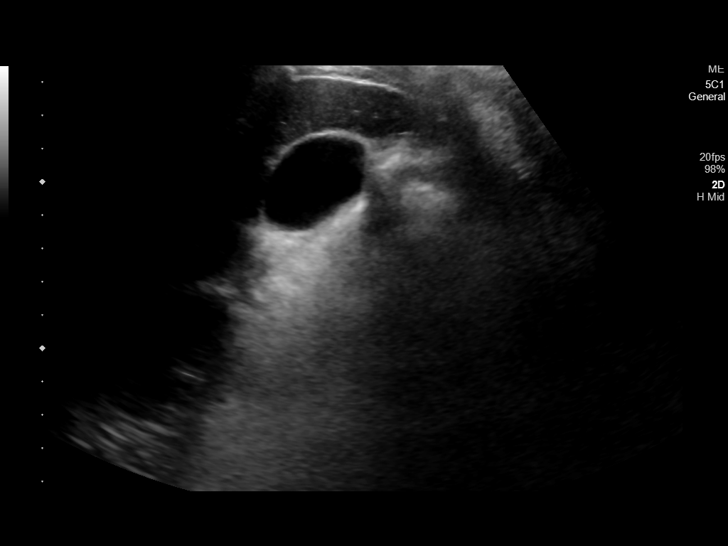
[im 13/35]
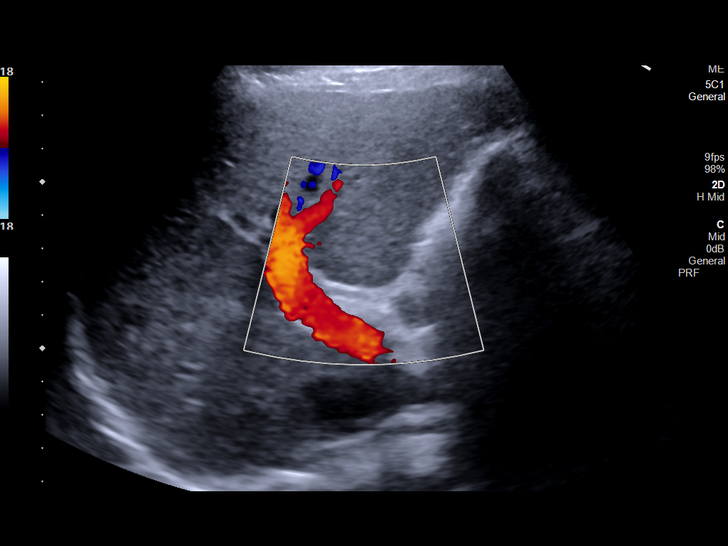
[im 16/35]
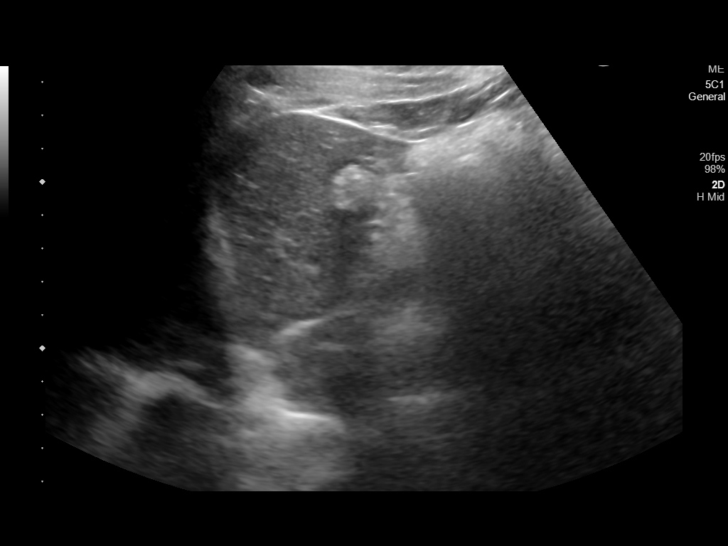
[im 19/35]
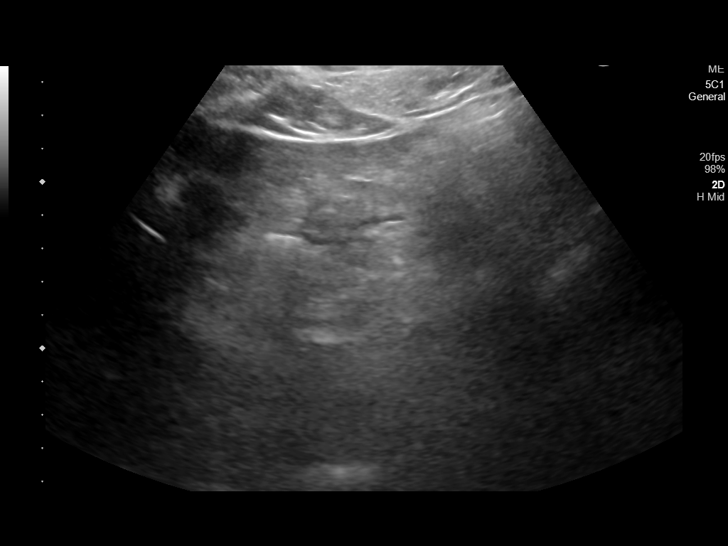
[im 22/35]
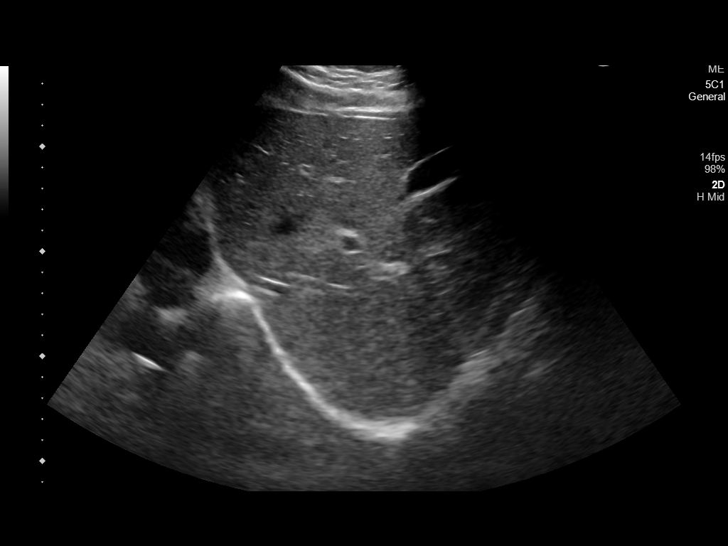
[im 23/35]
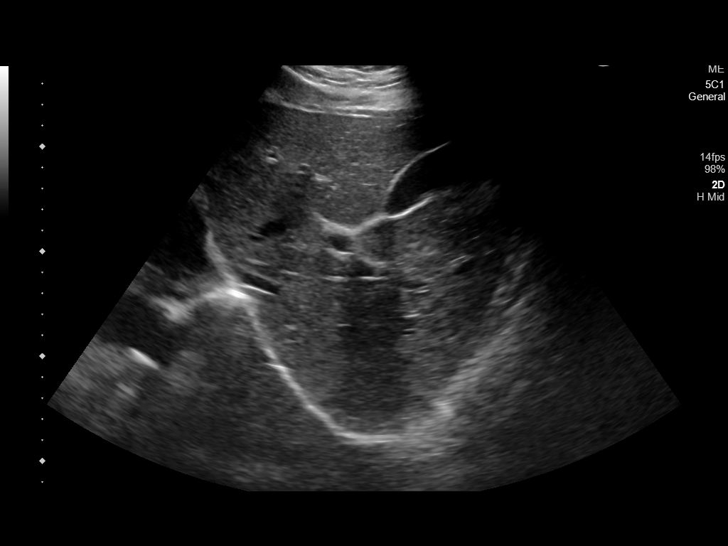
[im 26/35]
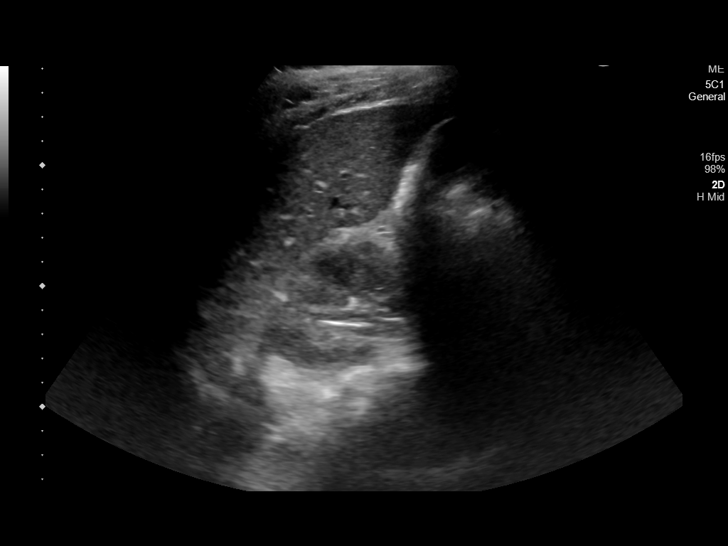
[im 29/35]
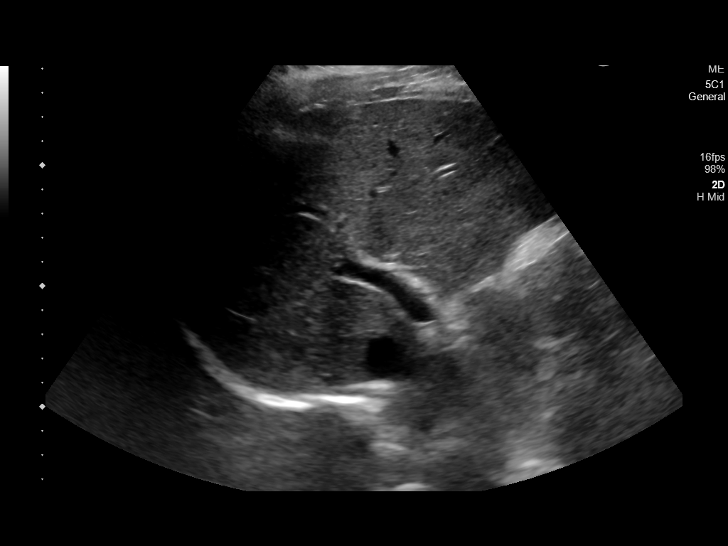
[im 32/35]
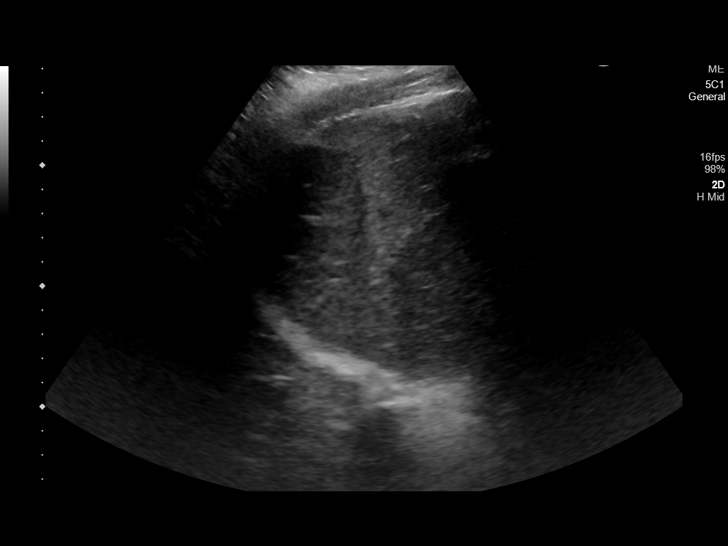
[im 35/35]
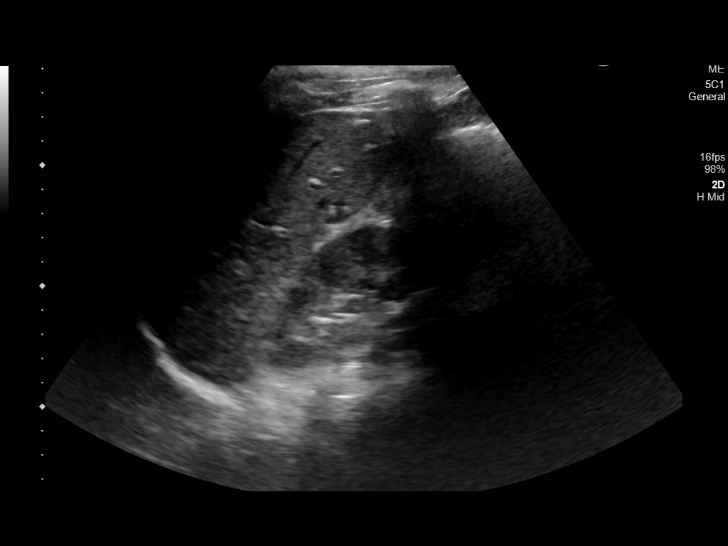

[14 of 25 positions shown; findings below may reference images not displayed]

FINDINGS: Gallbladder:

No gallstones or wall thickening visualized. There is no
pericholecystic fluid. No sonographic Murphy sign noted by
sonographer.

Common bile duct:

Diameter: 2 mm. No intrahepatic or extrahepatic biliary duct
dilatation.

Liver:

No focal lesion identified. Within normal limits in parenchymal
echogenicity. Portal vein is patent on color Doppler imaging with
normal direction of blood flow towards the liver.

Other: None.
IMPRESSION: Study within normal limits.
